# Patient Record
Sex: Male | Born: 1960 | Race: White | Hispanic: No | Smoking: Current some day smoker
Health system: Southern US, Community
[De-identification: ages and names within clinical notes are randomized; demographics above are authoritative.]

---

## 2004-06-20 ENCOUNTER — Ambulatory Visit: Payer: Self-pay | Admitting: Internal Medicine

## 2004-07-21 ENCOUNTER — Ambulatory Visit: Payer: Self-pay | Admitting: Internal Medicine

## 2004-08-20 ENCOUNTER — Ambulatory Visit: Payer: Self-pay | Admitting: Internal Medicine

## 2004-09-20 ENCOUNTER — Ambulatory Visit: Payer: Self-pay | Admitting: Internal Medicine

## 2004-11-25 ENCOUNTER — Ambulatory Visit: Payer: Self-pay | Admitting: Internal Medicine

## 2004-12-18 ENCOUNTER — Ambulatory Visit: Payer: Self-pay | Admitting: Internal Medicine

## 2005-01-22 ENCOUNTER — Ambulatory Visit: Payer: Self-pay | Admitting: Internal Medicine

## 2005-01-28 ENCOUNTER — Ambulatory Visit: Payer: Self-pay | Admitting: Internal Medicine

## 2005-02-04 ENCOUNTER — Ambulatory Visit: Payer: Self-pay | Admitting: Internal Medicine

## 2005-05-13 ENCOUNTER — Ambulatory Visit: Payer: Self-pay | Admitting: Internal Medicine

## 2005-05-21 ENCOUNTER — Ambulatory Visit: Payer: Self-pay | Admitting: Internal Medicine

## 2005-06-20 ENCOUNTER — Ambulatory Visit: Payer: Self-pay | Admitting: Internal Medicine

## 2005-09-07 ENCOUNTER — Ambulatory Visit: Payer: Self-pay | Admitting: Internal Medicine

## 2005-09-20 ENCOUNTER — Ambulatory Visit: Payer: Self-pay | Admitting: Internal Medicine

## 2005-11-04 ENCOUNTER — Ambulatory Visit: Payer: Self-pay | Admitting: Otolaryngology

## 2006-01-04 ENCOUNTER — Ambulatory Visit: Payer: Self-pay | Admitting: Internal Medicine

## 2006-01-11 ENCOUNTER — Ambulatory Visit: Payer: Self-pay | Admitting: Internal Medicine

## 2006-01-18 ENCOUNTER — Ambulatory Visit: Payer: Self-pay | Admitting: Internal Medicine

## 2006-02-18 ENCOUNTER — Ambulatory Visit: Payer: Self-pay | Admitting: Internal Medicine

## 2006-02-22 ENCOUNTER — Ambulatory Visit: Payer: Self-pay | Admitting: Internal Medicine

## 2006-05-11 ENCOUNTER — Ambulatory Visit: Payer: Self-pay | Admitting: Internal Medicine

## 2006-05-21 ENCOUNTER — Ambulatory Visit: Payer: Self-pay | Admitting: Internal Medicine

## 2006-06-13 IMAGING — CT NM PET TUM IMG RESTAG (PS) SKULL BASE T - THIGH
1 of 4 series · 2 of 25 positions shown · non-contrast
Comparison: none

REASON FOR EXAM: PtageL lymphoma lower neck  status post treatment  eval
recurrence
COMMENTS:

[Series 3: ct wb fusion · axial · 4.0mm · 0.98mm/px · z∈[+302,+352]mm · 2 of 488 slices shown]
[im 463/488  brain]
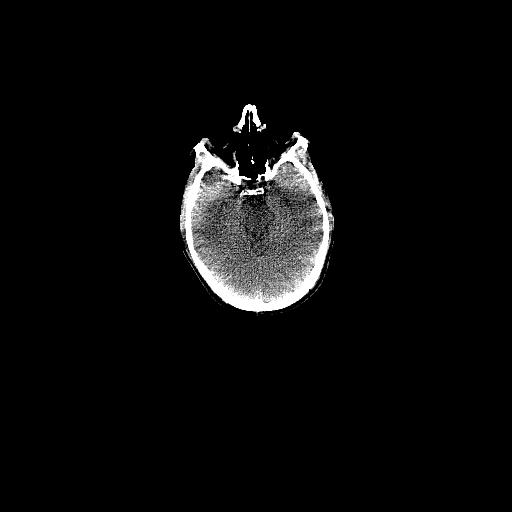
[im 488/488  brain]
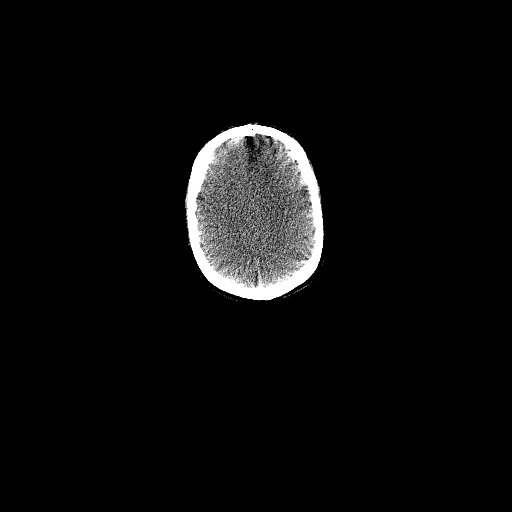

[2 of 25 positions shown; findings below may reference images not displayed]

PROCEDURE:     PET - PET/CT RESTAGING LYMPHOMA  - February 18, 2006  [DATE]

RESULT:       The patient is undergoing restaging with known lymphoma.  The
patient's fasting blood glucose level is 102 mg/dl.  The patient received
13.8 mCi of F18 labeled FDG.  On the images coned to the neck there is
increased uptake that is relatively diffuse within the tongue that is felt
to be most compatible with tongue motion resulting in increased metabolism.
I do not see abnormal uptake within the neck to suggest lymphopathy.

Activity over the thorax is within the limits of normal.  Activity within
the abdomen and pelvis is normal with no finding suspicious for active
malignancy. Specific attention to the region of the pelvic and inguinal
lymph nodes reveals no suspicious findings.
IMPRESSION: I do not see findings worrisome for recurrent malignancy.

## 2006-09-05 ENCOUNTER — Ambulatory Visit: Payer: Self-pay | Admitting: Internal Medicine

## 2006-09-20 ENCOUNTER — Ambulatory Visit: Payer: Self-pay | Admitting: Internal Medicine

## 2006-10-21 ENCOUNTER — Ambulatory Visit: Payer: Self-pay | Admitting: Internal Medicine

## 2007-01-18 ENCOUNTER — Ambulatory Visit: Payer: Self-pay | Admitting: Internal Medicine

## 2007-01-19 ENCOUNTER — Ambulatory Visit: Payer: Self-pay | Admitting: Internal Medicine

## 2007-07-22 ENCOUNTER — Ambulatory Visit: Payer: Self-pay | Admitting: Internal Medicine

## 2007-08-03 ENCOUNTER — Ambulatory Visit: Payer: Self-pay | Admitting: Internal Medicine

## 2007-08-21 ENCOUNTER — Ambulatory Visit: Payer: Self-pay | Admitting: Internal Medicine

## 2007-11-19 ENCOUNTER — Ambulatory Visit: Payer: Self-pay | Admitting: Oncology

## 2007-12-04 ENCOUNTER — Ambulatory Visit: Payer: Self-pay | Admitting: Internal Medicine

## 2007-12-20 ENCOUNTER — Ambulatory Visit: Payer: Self-pay | Admitting: Internal Medicine

## 2007-12-20 ENCOUNTER — Ambulatory Visit: Payer: Self-pay | Admitting: Oncology

## 2008-01-19 ENCOUNTER — Ambulatory Visit: Payer: Self-pay | Admitting: Oncology

## 2008-06-20 ENCOUNTER — Ambulatory Visit: Payer: Self-pay | Admitting: Internal Medicine

## 2008-07-04 ENCOUNTER — Ambulatory Visit: Payer: Self-pay | Admitting: Internal Medicine

## 2008-07-22 ENCOUNTER — Ambulatory Visit: Payer: Self-pay | Admitting: Internal Medicine

## 2008-08-20 ENCOUNTER — Ambulatory Visit: Payer: Self-pay | Admitting: Internal Medicine

## 2009-01-18 ENCOUNTER — Ambulatory Visit: Payer: Self-pay | Admitting: Internal Medicine

## 2009-01-27 ENCOUNTER — Ambulatory Visit: Payer: Self-pay | Admitting: Internal Medicine

## 2009-02-03 ENCOUNTER — Ambulatory Visit: Payer: Self-pay | Admitting: Internal Medicine

## 2009-02-18 ENCOUNTER — Ambulatory Visit: Payer: Self-pay | Admitting: Internal Medicine

## 2010-01-18 ENCOUNTER — Ambulatory Visit: Payer: Self-pay | Admitting: Internal Medicine

## 2010-01-30 ENCOUNTER — Ambulatory Visit: Payer: Self-pay | Admitting: Internal Medicine

## 2010-02-18 ENCOUNTER — Ambulatory Visit: Payer: Self-pay | Admitting: Internal Medicine

## 2013-08-07 ENCOUNTER — Ambulatory Visit: Payer: Self-pay | Admitting: Gastroenterology

## 2022-03-18 ENCOUNTER — Ambulatory Visit
Admission: EM | Admit: 2022-03-18 | Discharge: 2022-03-18 | Disposition: A | Discharge status: Home / Self Care | Payer: 59

## 2022-03-18 ENCOUNTER — Other Ambulatory Visit: Payer: Self-pay

## 2022-03-18 DIAGNOSIS — S20459A Superficial foreign body of unspecified back wall of thorax, initial encounter: Secondary | ICD-10-CM

## 2022-03-18 DIAGNOSIS — S20469A Insect bite (nonvenomous) of unspecified back wall of thorax, initial encounter: Secondary | ICD-10-CM | POA: Diagnosis not present

## 2022-03-18 DIAGNOSIS — W57XXXA Bitten or stung by nonvenomous insect and other nonvenomous arthropods, initial encounter: Secondary | ICD-10-CM

## 2022-03-18 DIAGNOSIS — R21 Rash and other nonspecific skin eruption: Secondary | ICD-10-CM

## 2022-03-18 MED ORDER — TRIAMCINOLONE ACETONIDE 0.5 % EX OINT: 1.0000 | TOPICAL_OINTMENT | Freq: Two times a day (BID) | CUTANEOUS | 0 refills | Status: AC

## 2022-03-18 MED ORDER — DOXYCYCLINE HYCLATE 100 MG PO CAPS: 100.0000 mg | ORAL_CAPSULE | Freq: Two times a day (BID) | ORAL | 0 refills | Status: AC

## 2022-03-18 NOTE — ED Provider Notes (Signed)
[facility]    CSN: [id number] Arrival date & time: 03/18/22  1905      History   Chief Complaint Chief Complaint  Patient presents with   Tick Removal    HPI Mark Holloway is a 61 y.o. male presenting for rash and itching of an area of his upper back.  He says he noticed the area was itchy a couple days ago.  He says his partner looked at the area and thought she saw a tick.  He denies any associated pain.  He has not any fevers or fatigue.  He has not treated condition in any way.  Says his partner has dogs and [place]  He says he does not know how he would have came into contact with a tick though.  No other complaints.  HPI  History reviewed. No pertinent past medical history.  There are no problems to display for this patient.   History reviewed. No pertinent surgical history.     Home Medications    Prior to Admission medications   Medication Sig Start Date End Date Taking? Authorizing Provider  allopurinol (ZYLOPRIM) 100 MG tablet 100 mg. 2 tablets daily 11/26/20  Yes [provider]  colchicine 0.6 MG tablet Take by mouth. 07/18/15  Yes [provider]  doxycycline (VIBRAMYCIN) 100 MG capsule Take 1 capsule (100 mg total) by mouth 2 (two) times daily for 14 days. 03/18/22 04/01/22 Yes Danton Clap, PA-C  sildenafil (VIAGRA) 100 MG tablet 1/3 tablet PRN 07/06/21  Yes [provider]  triamcinolone ointment (KENALOG) 0.5 % Apply 1 Application topically 2 (two) times daily. 03/18/22  Yes Danton Clap PA-C    Family History History reviewed. No pertinent family history.  Social History     Allergies   Bee venom   Review of Systems Review of Systems  Constitutional:  Negative for fatigue and fever.  Musculoskeletal:  Negative for arthralgias and joint swelling.  Skin:  Positive for rash.  Neurological:  Negative for weakness and headaches.     Physical Exam Triage Vital Signs ED Triage  Vitals  Enc Vitals Group     BP 03/18/22 1931 (!) 143/98     Pulse Rate 03/18/22 1931 95     Resp --      Temp 03/18/22 1931 98.4 F (36.9 C)     Temp Source 03/18/22 1931 Oral     SpO2 03/18/22 1931 95 %     Weight 03/18/22 1928 235 lb (106.6 kg)     Height 03/18/22 1928 5\' 10"  (1.778 m)     Head Circumference --      Peak Flow --      Pain Score 03/18/22 1927 0     Pain Loc --      Pain Edu? --      Excl. in [place]? --    No data found.  Updated Vital Signs BP (!) 143/98 (BP Location: Left Arm)   Pulse 95   Temp 98.4 F (36.9 C) (Oral)   Ht 5\' 10"  (1.778 m)   Wt 235 lb (106.6 kg)   SpO2 95%   BMI 33.72 kg/m   Physical Exam Vitals and nursing note reviewed.  Constitutional:      General: He is not in acute distress.    Appearance: Normal appearance. He is well-developed. He is not ill-appearing.  HENT:     Head: Normocephalic and atraumatic.  Eyes:  General: No scleral icterus.    Conjunctiva/sclera: Conjunctivae normal.  Cardiovascular:     Rate and Rhythm: Normal rate and regular rhythm.     Heart sounds: Normal heart sounds.  Pulmonary:     Effort: Pulmonary effort is normal. No respiratory distress.     Breath sounds: Normal breath sounds.  Musculoskeletal:     Cervical back: Neck supple.  Skin:    General: Skin is warm and dry.     Capillary Refill: Capillary refill takes less than 2 seconds.     Findings: Rash (there is an area of erythema and induration with attached dead tick of upper back) present.  Neurological:     General: No focal deficit present.     Mental Status: He is alert. Mental status is at baseline.     Motor: No weakness.     Gait: Gait normal.  Psychiatric:        Mood and Affect: Mood normal.        Behavior: Behavior normal.      UC Treatments / Results  Labs (all labs ordered are listed, but only abnormal results are displayed) Labs Reviewed - No data to display  EKG   Radiology No results  found.  Procedures Procedures (including critical care time)  Medications Ordered in UC Medications - No data to display  Initial Impression / Assessment and Plan / UC Course  I have reviewed the triage vital signs and the nursing notes.  Pertinent labs & imaging results that were available during my care of the patient were reviewed by me and considered in my medical decision making (see chart for details).  61 year old male presenting for pruritic rash and suspected tick foreign body of upper back for the past 2 to 3 days.  On exam he does have an area of erythema and induration of his upper back with attached dead tick.  Patient gives consent for foreign body removal.  Cleaned the area with alcohol and removed with sterile forceps.  Cleaned again with alcohol and applied bacitracin and covered with a Band-Aid.  Will treat prophylactically for possible tickborne illnesses and early cellulitis with doxycycline.  Also sent triamcinolone ointment to help with the swelling and pruritus.  Reviewed symptoms and signs related to tickborne illnesses and infection advised him to follow-up if he experiences any of those symptoms.   Final Clinical Impressions(s) / UC Diagnoses   Final diagnoses:  Rash and nonspecific skin eruption  Tick bite of back wall of thorax, unspecified location, initial encounter  Foreign body of back, unspecified laterality, initial encounter     Discharge Instructions      -I was able to remove the entire tick.  I have sent antibiotics to the pharmacy to try to prevent tickborne illnesses and infection. - If you notice a red ring around the rash restart have pustular drainage he should be seen and reevaluated.  Also be get a fever, body aches, fatigue or weakness you should be reevaluated. - I have sent triamcinolone ointment to help with the itching and swelling associated with the rash.     ED Prescriptions     Medication Sig Dispense Auth. Provider    doxycycline (VIBRAMYCIN) 100 MG capsule Take 1 capsule (100 mg total) by mouth 2 (two) times daily for 14 days. 28 capsule Eusebio Friendly B, PA-C   triamcinolone ointment (KENALOG) 0.5 % Apply 1 Application topically 2 (two) times daily. 30 g Shirlee Latch, PA-C  PDMP not reviewed this encounter.   Shirlee Latch, PA-C 03/18/22 2016

## 2022-03-18 NOTE — ED Triage Notes (Signed)
Tick bite, pt noticed 2-3 days ago, redness, tick is still attached, has a white marking in the middle, itching, no fever, fatigue

## 2022-03-18 NOTE — Discharge Instructions (Addendum)
-  I was able to remove the entire tick.  I have sent antibiotics to the pharmacy to try to prevent tickborne illnesses and infection. - If you notice a red ring around the rash restart have pustular drainage he should be seen and reevaluated.  Also be get a fever, body aches, fatigue or weakness you should be reevaluated. - I have sent triamcinolone ointment to help with the itching and swelling associated with the rash.

## 2022-05-31 ENCOUNTER — Ambulatory Visit
Admission: EM | Admit: 2022-05-31 | Discharge: 2022-05-31 | Disposition: A | Discharge status: Home / Self Care | Payer: 59 | Attending: Physician Assistant | Admitting: Physician Assistant

## 2022-05-31 DIAGNOSIS — Z87892 Personal history of anaphylaxis: Secondary | ICD-10-CM

## 2022-05-31 DIAGNOSIS — H00011 Hordeolum externum right upper eyelid: Secondary | ICD-10-CM | POA: Diagnosis not present

## 2022-05-31 MED ORDER — ERYTHROMYCIN 5 MG/GM OP OINT: TOPICAL_OINTMENT | OPHTHALMIC | 0 refills | Status: AC

## 2022-05-31 MED ORDER — CEPHALEXIN 500 MG PO CAPS: 500.0000 mg | ORAL_CAPSULE | Freq: Four times a day (QID) | ORAL | 0 refills | Status: AC

## 2022-05-31 MED ORDER — EPINEPHRINE 0.3 MG/0.3ML IJ SOAJ: 0.3000 mg | INTRAMUSCULAR | 0 refills | Status: AC | PRN

## 2022-05-31 NOTE — ED Provider Notes (Signed)
[facility]    CSN: [id number] Arrival date & time: 05/31/22  1509      History   Chief Complaint Chief Complaint  Patient presents with   Stye    right    HPI Mark Holloway is a 61 y.o. male presenting for 2-week history of right upper eyelid swelling, redness and pain.  Patient reports it is recently worsened over the past couple of days despite using warm compresses and an old antibiotic/corticosteroid ointment.  Denies associated fever or vision changes.  Patient also requests a prescription for an EpiPen.  States that he was in [place] within the past 2 months and had anaphylactic reaction to something unknown.  He reports that he broke out in severe hives and had difficulty breathing and had to receive epi, steroids and fluids because his blood pressure was low.  Patient reports he was not given a prescription for an EpiPen and thinks he should have one on hand.  He does a lot of traveling for his job and has to go back to [place] and also [place].  HPI  History reviewed. No pertinent past medical history.  There are no problems to display for this patient.   History reviewed. No pertinent surgical history.     Home Medications    Prior to Admission medications   Medication Sig Start Date End Date Taking? Authorizing Provider  allopurinol (ZYLOPRIM) 100 MG tablet 100 mg. 2 tablets daily 11/26/20  Yes [provider]  cephALEXin (KEFLEX) 500 MG capsule Take 1 capsule (500 mg total) by mouth 4 (four) times daily for 7 days. 05/31/22 06/07/22 Yes Eusebio Friendly B, PA-C  colchicine 0.6 MG tablet Take by mouth. 07/18/15  Yes [provider]  EPINEPHrine 0.3 mg/0.3 mL IJ SOAJ injection Inject 0.3 mg into the muscle as needed for anaphylaxis. 05/31/22  Yes Eusebio Friendly B, PA-C  erythromycin ophthalmic ointment Place a 1/2 inch ribbon of ointment into the upper eyelid q6h 05/31/22 06/07/22 Yes Shirlee Latch, PA-C  sildenafil (VIAGRA) 100 MG tablet  1/3 tablet PRN 07/06/21  Yes [provider]  triamcinolone ointment (KENALOG) 0.5 % Apply 1 Application topically 2 (two) times daily. 03/18/22  Yes Shirlee Latch PA-C    Family History History reviewed. No pertinent family history.  Social History Social History   Tobacco Use   Smoking status: Some Days    Types: Cigars   Smokeless tobacco: Never  Vaping Use   Vaping Use: Never used  Substance Use Topics   Alcohol use: Yes   Drug use: Never     Allergies   Bee venom   Review of Systems Review of Systems  Constitutional:  Negative for fatigue and fever.  Eyes:  Positive for pain. Negative for photophobia, discharge, redness, itching and visual disturbance.  Neurological:  Negative for dizziness and headaches.     Physical Exam Triage Vital Signs ED Triage Vitals  Enc Vitals Group     BP      Pulse      Resp      Temp      Temp src      SpO2      Weight      Height      Head Circumference      Peak Flow      Pain Score      Pain Loc      Pain Edu?      Excl. in [place]?  No data found.  Updated Vital Signs BP (!) 130/98 (BP Location: Left Arm)   Pulse 84   Temp 98.3 F (36.8 C) (Oral)   Resp 16   Ht 5\' 10"  (1.778 m)   Wt 235 lb (106.6 kg)   SpO2 100%   BMI 33.72 kg/m   Visual Acuity Right Eye Distance:   Left Eye Distance:   Bilateral Distance:       Physical Exam Vitals and nursing note reviewed.  Constitutional:      General: He is not in acute distress.    Appearance: Normal appearance. He is well-developed. He is not ill-appearing.  HENT:     Head: Normocephalic and atraumatic.  Eyes:     General: No scleral icterus.       Right eye: No discharge.     Conjunctiva/sclera: Conjunctivae normal.     Pupils: Pupils are equal, round, and reactive to light.     Comments: Large stye of right upper eyelid, externally.  Very tender to palpation.  Diffuse erythema of the entire eyelid.  Cardiovascular:     Rate and Rhythm: Normal  rate and regular rhythm.  Pulmonary:     Effort: Pulmonary effort is normal. No respiratory distress.     Breath sounds: Normal breath sounds.  Musculoskeletal:     Cervical back: Neck supple.  Skin:    General: Skin is warm and dry.     Capillary Refill: Capillary refill takes less than 2 seconds.  Neurological:     General: No focal deficit present.     Mental Status: He is alert.     Motor: No weakness.     Gait: Gait normal.  Psychiatric:        Mood and Affect: Mood normal.      UC Treatments / Results  Labs (all labs ordered are listed, but only abnormal results are displayed) Labs Reviewed - No data to display  EKG   Radiology No results found.  Procedures Procedures (including critical care time)  Medications Ordered in UC Medications - No data to display  Initial Impression / Assessment and Plan / UC Course  I have reviewed the triage vital signs and the nursing notes.  Pertinent labs & imaging results that were available during my care of the patient were reviewed by me and considered in my medical decision making (see chart for details).   61 year old male presenting for large stye of the right upper eyelid for the past couple of weeks.  He also requests an EpiPen given the fact that he had a significant allergic reaction to an unknown trigger while in [place] over the past 1 to 2 months.  I did review his notes from his visit to [place] which showed that he did have an anaphylactic reaction and the cause was undetermined.  I have filled the EpiPen for him and explained how to use it and when.  Treating his very large dye at this time with Keflex and erythromycin ointment.  Advise close monitoring and follow-up as needed especially if symptoms acutely worsen.   Final Clinical Impressions(s) / UC Diagnoses   Final diagnoses:  Hordeolum externum of right upper eyelid  History of anaphylaxis     Discharge Instructions      -I sent an antibiotic ointment and  oral antibiotic for your significant stye.  He use warm compresses but do so more frequently about every 2 hours. - The area will likely open and draining some.  Make sure  to keep the eye clean. - Return or go to ER if the swelling acutely worsens or you have pain or fever. - I have given you prescription for an EpiPen and printed information about how to use it.  Use this if you have difficulty breathing, facial swelling or numbness, throat tightness, chest tightness related to allergic reaction.  Then you will need to call 911 or have someone else do so immediately.     ED Prescriptions     Medication Sig Dispense Auth. Provider   cephALEXin (KEFLEX) 500 MG capsule Take 1 capsule (500 mg total) by mouth 4 (four) times daily for 7 days. 28 capsule Laurene Footman B, PA-C   erythromycin ophthalmic ointment Place a 1/2 inch ribbon of ointment into the upper eyelid q6h 3.5 g Carlyon Prows, Butch Otterson B, PA-C   EPINEPHrine 0.3 mg/0.3 mL IJ SOAJ injection Inject 0.3 mg into the muscle as needed for anaphylaxis. 1 each Gretta Cool      PDMP not reviewed this encounter.   Danton Clap, PA-C 05/31/22 1658

## 2022-05-31 NOTE — ED Triage Notes (Signed)
Pt c/o stye in right eyelid x2weeks  Pt states that it has increased in size in the last 3 days.  Pt wanted to ask for an epipen as he is travelling to [place] and had an allergic reaction to food.

## 2022-05-31 NOTE — Discharge Instructions (Signed)
-  I sent an antibiotic ointment and oral antibiotic for your significant stye.  He use warm compresses but do so more frequently about every 2 hours. - The area will likely open and draining some.  Make sure to keep the eye clean. - Return or go to ER if the swelling acutely worsens or you have pain or fever. - I have given you prescription for an EpiPen and printed information about how to use it.  Use this if you have difficulty breathing, facial swelling or numbness, throat tightness, chest tightness related to allergic reaction.  Then you will need to call 911 or have someone else do so immediately.
